# Patient Record
Sex: Male | Born: 1970 | Hispanic: Yes | Marital: Married | Smoking: Former smoker
Health system: Southern US, Community
[De-identification: ages and names within clinical notes are randomized; demographics above are authoritative.]

## PROBLEM LIST (undated history)

## (undated) HISTORY — PX: NO PAST SURGERIES: SHX2092

---

## 2020-07-25 ENCOUNTER — Inpatient Hospital Stay (HOSPITAL_COMMUNITY)
Admission: EM | Admit: 2020-07-25 | Discharge: 2020-07-29 | DRG: 177 | Disposition: A | Discharge status: Home / Self Care | Payer: HRSA Program | Attending: Internal Medicine | Admitting: Internal Medicine

## 2020-07-25 ENCOUNTER — Encounter (HOSPITAL_COMMUNITY): Payer: Self-pay

## 2020-07-25 ENCOUNTER — Emergency Department (HOSPITAL_COMMUNITY): Payer: HRSA Program

## 2020-07-25 ENCOUNTER — Other Ambulatory Visit: Payer: Self-pay

## 2020-07-25 DIAGNOSIS — Z87891 Personal history of nicotine dependence: Secondary | ICD-10-CM

## 2020-07-25 DIAGNOSIS — E871 Hypo-osmolality and hyponatremia: Secondary | ICD-10-CM | POA: Diagnosis present

## 2020-07-25 DIAGNOSIS — J1282 Pneumonia due to coronavirus disease 2019: Secondary | ICD-10-CM | POA: Diagnosis present

## 2020-07-25 DIAGNOSIS — J9601 Acute respiratory failure with hypoxia: Secondary | ICD-10-CM | POA: Diagnosis present

## 2020-07-25 DIAGNOSIS — J9600 Acute respiratory failure, unspecified whether with hypoxia or hypercapnia: Secondary | ICD-10-CM | POA: Diagnosis not present

## 2020-07-25 DIAGNOSIS — U071 COVID-19: Secondary | ICD-10-CM | POA: Diagnosis present

## 2020-07-25 DIAGNOSIS — E860 Dehydration: Secondary | ICD-10-CM | POA: Diagnosis present

## 2020-07-25 DIAGNOSIS — R0902 Hypoxemia: Secondary | ICD-10-CM

## 2020-07-25 LAB — CBC WITH DIFFERENTIAL/PLATELET
Abs Immature Granulocytes: 0.21 10*3/uL — ABNORMAL HIGH (ref 0.00–0.07)
Basophils Absolute: 0.1 10*3/uL (ref 0.0–0.1)
Basophils Relative: 1 %
Eosinophils Absolute: 0 10*3/uL (ref 0.0–0.5)
Eosinophils Relative: 0 %
HCT: 50.6 % (ref 39.0–52.0)
Hemoglobin: 17.5 g/dL — ABNORMAL HIGH (ref 13.0–17.0)
Immature Granulocytes: 2 %
Lymphocytes Relative: 6 %
Lymphs Abs: 0.6 10*3/uL — ABNORMAL LOW (ref 0.7–4.0)
MCH: 30.9 pg (ref 26.0–34.0)
MCHC: 34.6 g/dL (ref 30.0–36.0)
MCV: 89.4 fL (ref 80.0–100.0)
Monocytes Absolute: 0.6 10*3/uL (ref 0.1–1.0)
Monocytes Relative: 5 %
Neutro Abs: 9.6 10*3/uL — ABNORMAL HIGH (ref 1.7–7.7)
Neutrophils Relative %: 86 %
Platelets: 213 10*3/uL (ref 150–400)
RBC: 5.66 MIL/uL (ref 4.22–5.81)
RDW: 11.9 % (ref 11.5–15.5)
WBC: 11 10*3/uL — ABNORMAL HIGH (ref 4.0–10.5)
nRBC: 0 % (ref 0.0–0.2)

## 2020-07-25 LAB — COMPREHENSIVE METABOLIC PANEL
ALT: 67 U/L — ABNORMAL HIGH (ref 0–44)
AST: 34 U/L (ref 15–41)
Albumin: 3.3 g/dL — ABNORMAL LOW (ref 3.5–5.0)
Alkaline Phosphatase: 81 U/L (ref 38–126)
Anion gap: 13 (ref 5–15)
BUN: 21 mg/dL — ABNORMAL HIGH (ref 6–20)
CO2: 22 mmol/L (ref 22–32)
Calcium: 9.2 mg/dL (ref 8.9–10.3)
Chloride: 98 mmol/L (ref 98–111)
Creatinine, Ser: 0.73 mg/dL (ref 0.61–1.24)
GFR, Estimated: >60 mL/min (ref >60)
Glucose, Bld: 147 mg/dL — ABNORMAL HIGH (ref 70–99)
Potassium: 4.3 mmol/L (ref 3.5–5.1)
Sodium: 133 mmol/L — ABNORMAL LOW (ref 135–145)
Total Bilirubin: 0.9 mg/dL (ref 0.3–1.2)
Total Protein: 7.7 g/dL (ref 6.5–8.1)

## 2020-07-25 LAB — PROCALCITONIN: Procalcitonin: <0.1 ng/mL

## 2020-07-25 LAB — LACTATE DEHYDROGENASE: LDH: 222 U/L — ABNORMAL HIGH (ref 98–192)

## 2020-07-25 LAB — LACTIC ACID, PLASMA
Lactic Acid, Venous: 1.1 mmol/L (ref 0.5–1.9)
Lactic Acid, Venous: 1.4 mmol/L (ref 0.5–1.9)

## 2020-07-25 LAB — TRIGLYCERIDES: Triglycerides: 111 mg/dL (ref <150)

## 2020-07-25 LAB — C-REACTIVE PROTEIN: CRP: 9.6 mg/dL — ABNORMAL HIGH (ref <1.0)

## 2020-07-25 LAB — FERRITIN: Ferritin: 1139 ng/mL — ABNORMAL HIGH (ref 24–336)

## 2020-07-25 LAB — RESPIRATORY PANEL BY RT PCR (FLU A&B, COVID)
Influenza A by PCR: NEGATIVE
Influenza B by PCR: NEGATIVE
SARS Coronavirus 2 by RT PCR: POSITIVE — AB

## 2020-07-25 LAB — HIV ANTIBODY (ROUTINE TESTING W REFLEX): HIV Screen 4th Generation wRfx: NONREACTIVE

## 2020-07-25 LAB — FIBRINOGEN: Fibrinogen: >800 mg/dL — ABNORMAL HIGH (ref 210–475)

## 2020-07-25 LAB — D-DIMER, QUANTITATIVE: D-Dimer, Quant: 0.73 ug/mL-FEU — ABNORMAL HIGH (ref 0.00–0.50)

## 2020-07-25 MED ORDER — SODIUM CHLORIDE 0.9 % IV SOLN
100.0000 mg | Freq: Every day | INTRAVENOUS | Status: AC
  Administered 2020-07-26 – 2020-07-29 (×4): 100 mg via INTRAVENOUS
  Filled 2020-07-25 (×4): qty 20

## 2020-07-25 MED ORDER — HYDROCOD POLST-CPM POLST ER 10-8 MG/5ML PO SUER: 5.0000 mL | Freq: Two times a day (BID) | ORAL | Status: DC | PRN

## 2020-07-25 MED ORDER — SODIUM CHLORIDE 0.9 % IV SOLN
200.0000 mg | Freq: Once | INTRAVENOUS | Status: AC
  Administered 2020-07-25: 200 mg via INTRAVENOUS
  Filled 2020-07-25: qty 200

## 2020-07-25 MED ORDER — ZINC SULFATE 220 (50 ZN) MG PO CAPS
220.0000 mg | ORAL_CAPSULE | Freq: Every day | ORAL | Status: DC
  Administered 2020-07-25 – 2020-07-29 (×5): 220 mg via ORAL
  Filled 2020-07-25 (×5): qty 1

## 2020-07-25 MED ORDER — BARICITINIB 2 MG PO TABS
2.0000 mg | ORAL_TABLET | Freq: Every day | ORAL | Status: DC
  Administered 2020-07-25 – 2020-07-26 (×2): 2 mg via ORAL
  Filled 2020-07-25 (×2): qty 1

## 2020-07-25 MED ORDER — SODIUM CHLORIDE 0.9 % IV SOLN: 200.0000 mg | Freq: Once | INTRAVENOUS | Status: DC

## 2020-07-25 MED ORDER — SODIUM CHLORIDE 0.9 % IV SOLN: 100.0000 mg | Freq: Every day | INTRAVENOUS | Status: DC

## 2020-07-25 MED ORDER — IPRATROPIUM-ALBUTEROL 20-100 MCG/ACT IN AERS
1.0000 | INHALATION_SPRAY | Freq: Four times a day (QID) | RESPIRATORY_TRACT | Status: DC
  Administered 2020-07-25 – 2020-07-29 (×14): 1 via RESPIRATORY_TRACT
  Filled 2020-07-25 (×2): qty 4

## 2020-07-25 MED ORDER — METHYLPREDNISOLONE SODIUM SUCC 40 MG IJ SOLR
0.5000 mg/kg | Freq: Once | INTRAMUSCULAR | Status: AC
  Administered 2020-07-25: 36.4 mg via INTRAVENOUS
  Filled 2020-07-25: qty 1

## 2020-07-25 MED ORDER — ENOXAPARIN SODIUM 40 MG/0.4ML ~~LOC~~ SOLN
40.0000 mg | SUBCUTANEOUS | Status: DC
  Administered 2020-07-25: 40 mg via SUBCUTANEOUS
  Filled 2020-07-25: qty 0.4

## 2020-07-25 MED ORDER — GUAIFENESIN-DM 100-10 MG/5ML PO SYRP: 10.0000 mL | ORAL_SOLUTION | ORAL | Status: DC | PRN

## 2020-07-25 MED ORDER — SODIUM CHLORIDE 0.9 % IV SOLN
500.0000 mg | INTRAVENOUS | Status: DC
  Administered 2020-07-25: 500 mg via INTRAVENOUS
  Filled 2020-07-25 (×2): qty 500

## 2020-07-25 MED ORDER — ONDANSETRON HCL 4 MG PO TABS: 4.0000 mg | ORAL_TABLET | Freq: Four times a day (QID) | ORAL | Status: DC | PRN

## 2020-07-25 MED ORDER — SODIUM CHLORIDE 0.9 % IV SOLN: INTRAVENOUS | Status: DC

## 2020-07-25 MED ORDER — ACETAMINOPHEN 325 MG PO TABS: 650.0000 mg | ORAL_TABLET | Freq: Four times a day (QID) | ORAL | Status: DC | PRN

## 2020-07-25 MED ORDER — ONDANSETRON HCL 4 MG/2ML IJ SOLN: 4.0000 mg | Freq: Four times a day (QID) | INTRAMUSCULAR | Status: DC | PRN

## 2020-07-25 MED ORDER — ASCORBIC ACID 500 MG PO TABS
500.0000 mg | ORAL_TABLET | Freq: Every day | ORAL | Status: DC
  Administered 2020-07-25 – 2020-07-29 (×5): 500 mg via ORAL
  Filled 2020-07-25 (×5): qty 1

## 2020-07-25 MED ORDER — DEXAMETHASONE SODIUM PHOSPHATE 10 MG/ML IJ SOLN
6.0000 mg | INTRAMUSCULAR | Status: DC
  Administered 2020-07-25: 6 mg via INTRAVENOUS
  Filled 2020-07-25: qty 1

## 2020-07-25 NOTE — ED Triage Notes (Signed)
Pt c/o increased sob.  Pt's son is COVID + and pt feels he has it too.  Pt is 88-89% on room air. Pt 02 sats drop to 87% with exertion.  Pt has not been vaccinated.

## 2020-07-25 NOTE — H&P (Signed)
History and Physical   Anthony Hodges TZG:[id number] DOB: 03/27/1971 DOA: 07/25/2020  Referring MD/NP/PA: Dr. Linwood Dibbles  PCP: Patient, No Pcp Per   Outpatient Specialists: None  Patient coming from: Home  Chief Complaint: Shortness of breath  HPI: Anthony Hodges is a 49 y.o. male with medical history significant of no significant past medical history previous tobacco abuse presenting with shortness of breath and cough.  He was diagnosed with Covid about 10 days ago.  Placed on 4 mg of dexamethasone at home.  He has had fever on and off which is all about 3 days ago.  In the last 2 days however he has had worsening shortness of breath cough which is nonproductive.  Patient also has felt weak and not himself.  He came to the ER where he was seen and evaluated.  He was found and confirmed to be COVID-19 positive.  He has hypoxia now with oxygen sat 88% on room air requiring 2 L.  Patient is therefore being admitted to the hospital with COVID-19 pneumonia with hypoxia.  He is on vaccinated.  He is scattered family members including his son that were also Covid positive.  No other chronic medical issues and no tobacco use now he had previous tobacco use..  ED Course: Temperature 99.3 blood pressure 130/95 pulse 70 respirate 27 oxygen sat 88% on room air.  White count is 11.1 hemoglobin 17.5 platelets 213.  Sodium 133 potassium 4.2 chloride 90 CO2 22 BUN 21 creatinine 0.73.  Procalcitonin 0.1.  Ferritin 1139 triglyceride 111 LDH 222 CRP of 9.6 and lactic acid 1.1.  Chest x-ray showed bilateral infiltrates consistent with COVID-19 infection.  Patient initiated on treatment for COVID-19 infection being admitted for further treatment.  Review of Systems: As per HPI otherwise 10 point review of systems negative.    History reviewed. No pertinent past medical history.  Past Surgical History:  Procedure Laterality Date   NO PAST SURGERIES       reports that he quit smoking about 30 years ago. His  smoking use included cigarettes. He has never used smokeless tobacco. He reports previous alcohol use. He reports that he does not use drugs.  No Known Allergies  History reviewed. No pertinent family history.   Prior to Admission medications   Medication Sig Start Date End Date Taking? Authorizing Provider  amoxicillin-clavulanate (AUGMENTIN) 875-125 MG tablet Take 1 tablet by mouth 2 (two) times daily. Start date : 07/18/20 07/18/20  Yes [provider]  dexamethasone (DECADRON) 4 MG tablet Take 4 mg by mouth daily.  07/18/20  Yes [provider]    Physical Exam: Vitals:   07/25/20 1317 07/25/20 1319 07/25/20 1630 07/25/20 1720  BP: 132/74  138/86   Pulse: 77  68 70  Resp: (!) 22  (!) 26 20  Temp: 99.3 F (37.4 C)     TempSrc: Oral     SpO2:   94% 93%  Weight:  72.6 kg    Height:  5\' 6"  (1.676 m)        Constitutional: Acutely ill looking, no distress Vitals:   07/25/20 1317 07/25/20 1319 07/25/20 1630 07/25/20 1720  BP: 132/74  138/86   Pulse: 77  68 70  Resp: (!) 22  (!) 26 20  Temp: 99.3 F (37.4 C)     TempSrc: Oral     SpO2:   94% 93%  Weight:  72.6 kg    Height:  5\' 6"  (1.676 m)     Eyes:  PERRL, lids and conjunctivae normal ENMT: Mucous membranes are dry. Posterior pharynx clear of any exudate or lesions.Normal dentition.  Neck: normal, supple, no masses, no thyromegaly Respiratory: Decreased air entry bilaterally with coarse breath sounds, no wheeze, bilateral rhonchi and increased respiratory effort. No accessory muscle use.  Cardiovascular: Regular rate and rhythm, no murmurs / rubs / gallops. No extremity edema. 2+ pedal pulses. No carotid bruits.  Abdomen: no tenderness, no masses palpated. No hepatosplenomegaly. Bowel sounds positive.  Musculoskeletal: no clubbing / cyanosis. No joint deformity upper and lower extremities. Good ROM, no contractures. Normal muscle tone.  Skin: no rashes, lesions, ulcers. No induration Neurologic: CN  2-12 grossly intact. Sensation intact, DTR normal. Strength 5/5 in all 4.  Psychiatric: Normal judgment and insight. Alert and oriented x 3.  Anxious mood.     Labs on Admission: I have personally reviewed following labs and imaging studies  CBC: Recent Labs  Lab 07/25/20 1705  WBC 11.0*  NEUTROABS 9.6*  HGB 17.5*  HCT 50.6  MCV 89.4  PLT 213   Basic Metabolic Panel: Recent Labs  Lab 07/25/20 1705  NA 133*  K 4.3  CL 98  CO2 22  GLUCOSE 147*  BUN 21*  CREATININE 0.73  CALCIUM 9.2   GFR: Estimated Creatinine Clearance: 100.8 mL/min (by C-G formula based on SCr of 0.73 mg/dL). Liver Function Tests: Recent Labs  Lab 07/25/20 1705  AST 34  ALT 67*  ALKPHOS 81  BILITOT 0.9  PROT 7.7  ALBUMIN 3.3*   No results for input(s): LIPASE, AMYLASE in the last 168 hours. No results for input(s): AMMONIA in the last 168 hours. Coagulation Profile: No results for input(s): INR, PROTIME in the last 168 hours. Cardiac Enzymes: No results for input(s): CKTOTAL, CKMB, CKMBINDEX, TROPONINI in the last 168 hours. BNP (last 3 results) No results for input(s): PROBNP in the last 8760 hours. HbA1C: No results for input(s): HGBA1C in the last 72 hours. CBG: No results for input(s): GLUCAP in the last 168 hours. Lipid Profile: Recent Labs    07/25/20 1700  TRIG 111   Thyroid Function Tests: No results for input(s): TSH, T4TOTAL, FREET4, T3FREE, THYROIDAB in the last 72 hours. Anemia Panel: No results for input(s): VITAMINB12, FOLATE, FERRITIN, TIBC, IRON, RETICCTPCT in the last 72 hours. Urine analysis: No results found for: COLORURINE, APPEARANCEUR, LABSPEC, PHURINE, GLUCOSEU, HGBUR, BILIRUBINUR, KETONESUR, PROTEINUR, UROBILINOGEN, NITRITE, LEUKOCYTESUR Sepsis Labs: @LABRCNTIP (procalcitonin:4,lacticidven:4) )No results found for this or any previous visit (from the past 240 hour(s)).   Radiological Exams on Admission: DG Chest Port 1 View  Result Date:  07/25/2020 CLINICAL DATA:  Shortness of breath, family member COVID positive EXAM: PORTABLE CHEST 1 VIEW COMPARISON:  None. FINDINGS: Patchy interstitial opacities at the lung bases. No pleural effusion. No pneumothorax. Normal heart size. IMPRESSION: Patchy interstitial opacities at the lung bases, which could reflect COVID-19. Electronically Signed   By: 07/27/2020 M.D.   On: 07/25/2020 14:21    EKG: Independently reviewed.  Shows sinus rhythm with nonspecific ST changes.  Assessment/Plan Principal Problem:   Acute respiratory failure due to COVID-19 [facility]) Active Problems:   Hyponatremia   Dehydration     #1 COVID-19 pneumonia with hypoxia: Patient will be admitted.  Initiate remdesivir kalemia dexamethasone as well as breathing treatments.  Cough medications as well as empiric treatment for secondary bacterial pneumonia with azithromycin.  Patient will be maintained on oxygen for now until titrated off.  Expect patient to span at least 2 midnights in the  hospital.  #2 hyponatremia: Most likely secondary dehydration.  We will hydrate slowly.  Goal is to keep him close to being dry as a Covid 19 infected individual.  #3 dehydration: As per #2 above  #4 leukocytosis: Secondary to pneumonia.  Monitor especially on steroids.   DVT prophylaxis: Lovenox Code Status: Full code Family Communication: No family at bedside Disposition Plan: Home Consults called: None Admission status: Inpatient  Severity of Illness: The appropriate patient status for this patient is INPATIENT. Inpatient status is judged to be reasonable and necessary in order to provide the required intensity of service to ensure the patient's safety. The patient's presenting symptoms, physical exam findings, and initial radiographic and laboratory data in the context of their chronic comorbidities is felt to place them at high risk for further clinical deterioration. Furthermore, it is not anticipated that the patient  will be medically stable for discharge from the hospital within 2 midnights of admission. The following factors support the patient status of inpatient.   " The patient's presenting symptoms include shortness of breath and cough. " The worrisome physical exam findings include coarse breath sound bilaterally with evidence of respiratory distress. " The initial radiographic and laboratory data are worrisome because of bilateral chest x-ray findings. " The chronic co-morbidities include none.   * I certify that at the point of admission it is my clinical judgment that the patient will require inpatient hospital care spanning beyond 2 midnights from the point of admission due to high intensity of service, high risk for further deterioration and high frequency of surveillance required.Lonia Blood MD [facility] Pager [phone]  If 7PM-7AM, please contact night-coverage [email or web address] Password [facility]  07/25/2020, 6:52 PM

## 2020-07-25 NOTE — ED Provider Notes (Signed)
[facility]-EMERGENCY DEPT Provider Note   CSN: [id number] Arrival date & time: 07/25/20  1234     History Chief Complaint  Patient presents with  . Shortness of Breath    Anthony Hodges is a 49 y.o. male presenting for evaluation of shortness of breath.  Patient states he developed symptoms of Covid 10 days ago.  He reports mostly a sore throat and body aches and fever.  Fever resolved 3 days ago.  Over the past 2 days, he has had gradually worsening shortness of breath, and today he woke up with severe shortness of breath and coughing fits.  This prompted his visit to the ER.  Patient states his son is Covid positive, he has not been tested.  He reports no other medical problems, takes medications daily.  No history of asthma or COPD.  He is unvaccinated for Covid.  Denies tobacco, alcohol, or drug use.  He has not taken anything for his symptoms.  HPI     History reviewed. No pertinent past medical history.  There are no problems to display for this patient.   Past Surgical History:  Procedure Laterality Date  . NO PAST SURGERIES         History reviewed. No pertinent family history.  Social History   Tobacco Use  . Smoking status: Former Smoker    Types: Cigarettes    Quit date: 07/25/1990    Years since quitting: 30.0  . Smokeless tobacco: Never Used  Vaping Use  . Vaping Use: Never used  Substance Use Topics  . Alcohol use: Not Currently    Comment: drank for 2 years when 49 years old  . Drug use: Never    Home Medications Prior to Admission medications   Medication Sig Start Date End Date Taking? Authorizing Provider  amoxicillin-clavulanate (AUGMENTIN) 875-125 MG tablet Take 1 tablet by mouth 2 (two) times daily. Start date : 07/18/20 07/18/20  Yes [provider]  dexamethasone (DECADRON) 4 MG tablet Take 4 mg by mouth daily.  07/18/20  Yes [provider]    Allergies    Patient has no known  allergies.  Review of Systems   Review of Systems  Constitutional: Positive for fever (resolved).  Respiratory: Positive for cough and shortness of breath.   All other systems reviewed and are negative.   Physical Exam Updated Vital Signs BP 138/86 (BP Location: Right Arm)   Pulse 70   Temp 99.3 F (37.4 C) (Oral)   Resp 20   Ht 5\' 6"  (1.676 m)   Wt 72.6 kg   SpO2 93%   BMI 25.82 kg/m   Physical Exam Vitals and nursing note reviewed.  Constitutional:      General: He is not in acute distress.    Appearance: He is well-developed.     Comments: Appears nontoxic  HENT:     Head: Normocephalic and atraumatic.  Eyes:     Conjunctiva/sclera: Conjunctivae normal.     Pupils: Pupils are equal, round, and reactive to light.  Cardiovascular:     Rate and Rhythm: Normal rate and regular rhythm.     Pulses: Normal pulses.  Pulmonary:     Effort: Pulmonary effort is normal. No respiratory distress.     Breath sounds: Rales present. No wheezing.     Comments: Scattered rales in lower bilateral lobes.  Speaking in full sentences.  On 2 L via nasal cannula, sats 93 to 94%.  Intermittent cough noted.  Per RN, patient  hypoxic in the 80s on room air at rest on arrival. Abdominal:     General: There is no distension.     Palpations: Abdomen is soft. There is no mass.     Tenderness: There is no abdominal tenderness. There is no guarding or rebound.  Musculoskeletal:        General: Normal range of motion.     Cervical back: Normal range of motion and neck supple.  Skin:    General: Skin is warm and dry.     Capillary Refill: Capillary refill takes less than 2 seconds.  Neurological:     Mental Status: He is alert and oriented to person, place, and time.     ED Results / Procedures / Treatments   Labs (all labs ordered are listed, but only abnormal results are displayed) Labs Reviewed  CBC WITH DIFFERENTIAL/PLATELET - Abnormal; Notable for the following components:      Result  Value   WBC 11.0 (*)    Hemoglobin 17.5 (*)    Neutro Abs 9.6 (*)    Lymphs Abs 0.6 (*)    Abs Immature Granulocytes 0.21 (*)    All other components within normal limits  COMPREHENSIVE METABOLIC PANEL - Abnormal; Notable for the following components:   Sodium 133 (*)    Glucose, Bld 147 (*)    BUN 21 (*)    Albumin 3.3 (*)    ALT 67 (*)    All other components within normal limits  LACTATE DEHYDROGENASE - Abnormal; Notable for the following components:   LDH 222 (*)    All other components within normal limits  RESPIRATORY PANEL BY RT PCR (FLU A&B, COVID)  CULTURE, BLOOD (ROUTINE X 2)  CULTURE, BLOOD (ROUTINE X 2)  LACTIC ACID, PLASMA  TRIGLYCERIDES  LACTIC ACID, PLASMA  D-DIMER, QUANTITATIVE (NOT AT [facility])  PROCALCITONIN  FERRITIN  FIBRINOGEN  C-REACTIVE PROTEIN    EKG EKG Interpretation  Date/Time:  Wednesday July 25 2020 15:41:42 EDT Ventricular Rate:  69 PR Interval:    QRS Duration: 91 QT Interval:  388 QTC Calculation: 416 R Axis:   -44 Text Interpretation: Sinus rhythm Left axis deviation RSR' in V1 or V2, right VCD or RVH Minimal ST elevation, anterior leads No old tracing to compare Confirmed by Linwood Dibbles [phone]) on 07/25/2020 4:07:43 PM   Radiology DG Chest Port 1 View  Result Date: 07/25/2020 CLINICAL DATA:  Shortness of breath, family member COVID positive EXAM: PORTABLE CHEST 1 VIEW COMPARISON:  None. FINDINGS: Patchy interstitial opacities at the lung bases. No pleural effusion. No pneumothorax. Normal heart size. IMPRESSION: Patchy interstitial opacities at the lung bases, which could reflect COVID-19. Electronically Signed   By: Guadlupe Spanish M.D.   On: 07/25/2020 14:21    Procedures Procedures (including critical care time)  Medications Ordered in ED Medications  methylPREDNISolone sodium succinate (SOLU-MEDROL) 40 mg/mL injection 36.4 mg (36.4 mg Intravenous Given 07/25/20 1712)    ED Course  I have reviewed the triage vital signs and  the nursing notes.  Pertinent labs & imaging results that were available during my care of the patient were reviewed by me and considered in my medical decision making (see chart for details).    MDM Rules/Calculators/A&P                          Patient presenting for evaluation of shortness of breath and cough.  He developed symptoms of Covid 10 days ago, son  is Covid positive.  Shortness of breath worsened acutely today.  On exam, patient appears nontoxic, although he did arrive hypoxic in the 80s.  This is improved with 2 L via nasal cannula.  Will obtain inflammatory markers, chest x-ray, and give Solu-Medrol.  Patient will need to be admitted due to hypoxia in the setting of a likely Covid infection.  Chest x-ray viewed interpreted by me, shows mild hazy opacities consistent with Covid pneumonia.  Labs overall reassuring.  Some inflammatory markers elevated.  Patient remained stable on 2 L.  Will call for admission.  Discussed with Dr. Mikeal Hawthorne from [facility], patient to be admitted.  Anthony Hodges was evaluated in Emergency Department on 07/25/2020 for the symptoms described in the history of present illness. He was evaluated in the context of the global COVID-19 pandemic, which necessitated consideration that the patient might be at risk for infection with the SARS-CoV-2 virus that causes COVID-19. Institutional protocols and algorithms that pertain to the evaluation of patients at risk for COVID-19 are in a state of rapid change based on information released by regulatory bodies including the CDC and federal and state organizations. These policies and algorithms were followed during the patient's care in the ED.  Final Clinical Impression(s) / ED Diagnoses Final diagnoses:  COVID-19  Hypoxia  Pneumonia due to COVID-19 virus    Rx / DC Orders ED Discharge Orders    None       Alveria Apley, PA-C 07/25/20 1847    Linwood Dibbles, MD 07/26/20 [phone]

## 2020-07-25 NOTE — ED Notes (Signed)
Date and time results received: 07/25/20 1920  Test: Respiratory Panel  Critical Value: Positive   Name of Provider Notified: Mikeal Hawthorne, MD

## 2020-07-26 DIAGNOSIS — J9600 Acute respiratory failure, unspecified whether with hypoxia or hypercapnia: Secondary | ICD-10-CM | POA: Diagnosis not present

## 2020-07-26 DIAGNOSIS — E860 Dehydration: Secondary | ICD-10-CM | POA: Diagnosis not present

## 2020-07-26 DIAGNOSIS — E871 Hypo-osmolality and hyponatremia: Secondary | ICD-10-CM

## 2020-07-26 DIAGNOSIS — J9601 Acute respiratory failure with hypoxia: Secondary | ICD-10-CM

## 2020-07-26 DIAGNOSIS — U071 COVID-19: Secondary | ICD-10-CM | POA: Diagnosis not present

## 2020-07-26 LAB — COMPREHENSIVE METABOLIC PANEL
ALT: 73 U/L — ABNORMAL HIGH (ref 0–44)
AST: 41 U/L (ref 15–41)
Albumin: 2.8 g/dL — ABNORMAL LOW (ref 3.5–5.0)
Alkaline Phosphatase: 64 U/L (ref 38–126)
Anion gap: 9 (ref 5–15)
BUN: 18 mg/dL (ref 6–20)
CO2: 22 mmol/L (ref 22–32)
Calcium: 8.2 mg/dL — ABNORMAL LOW (ref 8.9–10.3)
Chloride: 101 mmol/L (ref 98–111)
Creatinine, Ser: 0.71 mg/dL (ref 0.61–1.24)
GFR, Estimated: >60 mL/min (ref >60)
Glucose, Bld: 151 mg/dL — ABNORMAL HIGH (ref 70–99)
Potassium: 4.2 mmol/L (ref 3.5–5.1)
Sodium: 132 mmol/L — ABNORMAL LOW (ref 135–145)
Total Bilirubin: 0.8 mg/dL (ref 0.3–1.2)
Total Protein: 6.7 g/dL (ref 6.5–8.1)

## 2020-07-26 LAB — CBC WITH DIFFERENTIAL/PLATELET
Abs Immature Granulocytes: 0.2 10*3/uL — ABNORMAL HIGH (ref 0.00–0.07)
Basophils Absolute: 0.1 10*3/uL (ref 0.0–0.1)
Basophils Relative: 1 %
Eosinophils Absolute: 0 10*3/uL (ref 0.0–0.5)
Eosinophils Relative: 0 %
HCT: 46.5 % (ref 39.0–52.0)
Hemoglobin: 16 g/dL (ref 13.0–17.0)
Immature Granulocytes: 2 %
Lymphocytes Relative: 11 %
Lymphs Abs: 0.9 10*3/uL (ref 0.7–4.0)
MCH: 31 pg (ref 26.0–34.0)
MCHC: 34.4 g/dL (ref 30.0–36.0)
MCV: 90.1 fL (ref 80.0–100.0)
Monocytes Absolute: 0.7 10*3/uL (ref 0.1–1.0)
Monocytes Relative: 8 %
Neutro Abs: 6.4 10*3/uL (ref 1.7–7.7)
Neutrophils Relative %: 78 %
Platelets: 231 10*3/uL (ref 150–400)
RBC: 5.16 MIL/uL (ref 4.22–5.81)
RDW: 11.9 % (ref 11.5–15.5)
WBC: 8.2 10*3/uL (ref 4.0–10.5)
nRBC: 0 % (ref 0.0–0.2)

## 2020-07-26 LAB — PHOSPHORUS: Phosphorus: 3.2 mg/dL (ref 2.5–4.6)

## 2020-07-26 LAB — C-REACTIVE PROTEIN: CRP: 4.3 mg/dL — ABNORMAL HIGH (ref <1.0)

## 2020-07-26 LAB — FERRITIN: Ferritin: 953 ng/mL — ABNORMAL HIGH (ref 24–336)

## 2020-07-26 LAB — D-DIMER, QUANTITATIVE: D-Dimer, Quant: 0.83 ug/mL-FEU — ABNORMAL HIGH (ref 0.00–0.50)

## 2020-07-26 LAB — MAGNESIUM: Magnesium: 2.4 mg/dL (ref 1.7–2.4)

## 2020-07-26 MED ORDER — METHYLPREDNISOLONE SODIUM SUCC 40 MG IJ SOLR
40.0000 mg | Freq: Two times a day (BID) | INTRAMUSCULAR | Status: DC
  Administered 2020-07-26 – 2020-07-28 (×5): 40 mg via INTRAVENOUS
  Filled 2020-07-26 (×5): qty 1

## 2020-07-26 MED ORDER — HYDROCOD POLST-CPM POLST ER 10-8 MG/5ML PO SUER
5.0000 mL | Freq: Two times a day (BID) | ORAL | Status: DC
  Administered 2020-07-26 – 2020-07-29 (×7): 5 mL via ORAL
  Filled 2020-07-26 (×7): qty 5

## 2020-07-26 MED ORDER — BARICITINIB 2 MG PO TABS
4.0000 mg | ORAL_TABLET | Freq: Every day | ORAL | Status: DC
  Administered 2020-07-27 – 2020-07-29 (×3): 4 mg via ORAL
  Filled 2020-07-26 (×3): qty 2

## 2020-07-26 MED ORDER — ALBUTEROL SULFATE HFA 108 (90 BASE) MCG/ACT IN AERS
1.0000 | INHALATION_SPRAY | RESPIRATORY_TRACT | Status: DC | PRN
  Filled 2020-07-26: qty 6.7

## 2020-07-26 MED ORDER — BARICITINIB 2 MG PO TABS
2.0000 mg | ORAL_TABLET | Freq: Once | ORAL | Status: AC
  Administered 2020-07-26: 2 mg via ORAL
  Filled 2020-07-26: qty 1

## 2020-07-26 NOTE — ED Notes (Signed)
Pt received meal tray.CS °

## 2020-07-26 NOTE — Progress Notes (Signed)
Patient arrived from ED via stretcher. Ambulate to bedside with steady gait. All VSS and 92% on Room Air. No complaints or signs of distress noted. Call bell in reach. Will continue to monitor.

## 2020-07-26 NOTE — Progress Notes (Addendum)
PROGRESS NOTE    Anthony Hodges  OVF:[id number] DOB: [birth year] DOA: 07/25/2020 PCP: Patient, No Pcp Per    Brief Narrative:  Anthony Hodges was admitted to the hospital with the working diagnosis of acute hypoxic respiratory failure due to SARS COVID-19 viral pneumonia.  49 year old male with no significant past medical history who presents with dyspnea and cough.  He was diagnosed with SARS COVID-19 on 07/15/2020.  He was treated as an outpatient with 4 mg of dexamethasone, despite systemic steroids he continued to have intermittent fevers and for the last 2 days worsening dyspnea that prompted him to come to the hospital.  On his initial physical examination his oxygen saturation was 88% on room air, temperature 99.3, blood pressure 130/95, heart rate 70, respiratory rate 27.  His lungs had decreased breath sounds bilaterally, no wheezing but bilateral rhonchi.  Increased work of breathing.  Heart S1-S2, present rhythmic, soft abdomen, no lower extremity edema. Sodium 133, potassium 4.3, chloride 98, bicarb 22, glucose 147, BUN 21, creatinine 0.73, white count 11.0, hemoglobin 17.5, hematocrit 50.6, platelets 213.  SARS COVID-19 positive. Chest radiograph with bilateral lower lobe faint interstitial infiltrates. EKG 69 bpm, left axis deviation, normal intervals, sinus rhythm, J-point elevation V1 through V3, no ST segment or T wave changes.   Assessment & Plan:   Principal Problem:   Pneumonia due to COVID-19 virus Active Problems:   Hyponatremia   Dehydration   Acute respiratory failure with hypoxia (HCC)   1.  Acute hypoxic respiratory failure due to SARS COVID-19 viral pneumonia.  RR: 22 to 29  Pulse oxymetry: 92 to 93%  Fi02: 2 L/min per Rachel   COVID-19 Labs  Recent Labs    07/25/20 1705 07/25/20 1707 07/26/20 0558  DDIMER 0.73*  --  0.83*  FERRITIN  --  1,139* 953*  LDH 222*  --   --   CRP  --  9.6* 4.3*    Lab Results  Component Value Date   SARSCOV2NAA POSITIVE (A)  07/25/2020    Patient with low oxygen requirements, less than 5 L/min  Continue medical therapy with systemic steroids (change dexamethasone for methylprednisolone), baricitinib and antiviral therapy with Remdesivir. Bronchodilator therapy, antitussive agents and airway clearing techniques with incentive spirometer and flutter valve.   Continue to follow up on inflammatory markers and oxygen requirements.  Out of bed to chair tid with meals.   2. Hypovolemic hyponatremia. Na continue to be low at 133, will continue to encourage po intake, hold on IV fluids in the setting of viral pneumonia.  Follow up on renal function and electrolytes in am.   3. Reactive leukocytosis. Wbc is down to 8.2. No clinical signs of bacterial pneumonia.   Patient continue to be at high risk for worsening hypoxemic respiratory failure.   Status is: Inpatient  Remains inpatient appropriate because:IV treatments appropriate due to intensity of illness or inability to take PO   Dispo: The patient is from: Home              Anticipated d/c is to: Home              Anticipated d/c date is: 3 days              Patient currently is not medically stable to d/c.   DVT prophylaxis: Enoxaparin   Code Status:   full  Family Communication:  Patient communicating with family.      Subjective: Patient with persistent dyspnea but mild improvement compared to last night,  not yet back to baseline, no nausea or vomiting.   Objective: Vitals:   07/26/20 0840 07/26/20 0926 07/26/20 1009 07/26/20 1028  BP: (!) 143/96 (!) 137/94 (!) 136/111 (!) 158/99  Pulse: 84 78 86 82  Resp: (!) 24 (!) 25 (!) 29 (!) 22  Temp:  98 F (36.7 C) 98.7 F (37.1 C) 97.6 F (36.4 C)  TempSrc:  Oral  Oral  SpO2: 90% 93% 93% 92%  Weight:      Height:       No intake or output data in the 24 hours ending 07/26/20 1032 Filed Weights   07/25/20 1319  Weight: 72.6 kg    Examination:   General: Not in pain or dyspnea. Deconditioned   Neurology: Awake and alert, non focal  E ENT: mild pallor, no icterus, oral mucosa moist Cardiovascular: No JVD. S1-S2 present, rhythmic, no gallops, rubs, or murmurs. No lower extremity edema. Pulmonary: positive breath sounds bilaterally,with no wheezing, rhonchi or rales. Gastrointestinal. Abdomen soft and non tender Skin. No rashes Musculoskeletal: no joint deformities     Data Reviewed: I have personally reviewed following labs and imaging studies  CBC: Recent Labs  Lab 07/25/20 1705 07/26/20 0558  WBC 11.0* 8.2  NEUTROABS 9.6* 6.4  HGB 17.5* 16.0  HCT 50.6 46.5  MCV 89.4 90.1  PLT 213 231   Basic Metabolic Panel: Recent Labs  Lab 07/25/20 1705 07/26/20 0558  NA 133* 132*  K 4.3 4.2  CL 98 101  CO2 22 22  GLUCOSE 147* 151*  BUN 21* 18  CREATININE 0.73 0.71  CALCIUM 9.2 8.2*  MG  --  2.4  PHOS  --  3.2   GFR: Estimated Creatinine Clearance: 100.8 mL/min (by C-G formula based on SCr of 0.71 mg/dL). Liver Function Tests: Recent Labs  Lab 07/25/20 1705 07/26/20 0558  AST 34 41  ALT 67* 73*  ALKPHOS 81 64  BILITOT 0.9 0.8  PROT 7.7 6.7  ALBUMIN 3.3* 2.8*   No results for input(s): LIPASE, AMYLASE in the last 168 hours. No results for input(s): AMMONIA in the last 168 hours. Coagulation Profile: No results for input(s): INR, PROTIME in the last 168 hours. Cardiac Enzymes: No results for input(s): CKTOTAL, CKMB, CKMBINDEX, TROPONINI in the last 168 hours. BNP (last 3 results) No results for input(s): PROBNP in the last 8760 hours. HbA1C: No results for input(s): HGBA1C in the last 72 hours. CBG: No results for input(s): GLUCAP in the last 168 hours. Lipid Profile: Recent Labs    07/25/20 1700  TRIG 111   Thyroid Function Tests: No results for input(s): TSH, T4TOTAL, FREET4, T3FREE, THYROIDAB in the last 72 hours. Anemia Panel: Recent Labs    07/25/20 1707 07/26/20 0558  FERRITIN 1,139* 953*      Radiology Studies: I have reviewed  all of the imaging during this hospital visit personally     Scheduled Meds: . vitamin C  500 mg Oral Daily  . baricitinib  2 mg Oral Daily  . dexamethasone (DECADRON) injection  6 mg Intravenous Q24H  . enoxaparin (LOVENOX) injection  40 mg Subcutaneous Q24H  . Ipratropium-Albuterol  1 puff Inhalation Q6H  . zinc sulfate  220 mg Oral Daily   Continuous Infusions: . sodium chloride 100 mL/hr at 07/25/20 2007  . azithromycin Stopped (07/25/20 2108)  . remdesivir 100 mg in NS 100 mL       LOS: 1 day        Camdyn Beske Annett Gula, MD

## 2020-07-26 NOTE — ED Notes (Signed)
EMS-report given to Katie, RN-transport to floor

## 2020-07-27 DIAGNOSIS — U071 COVID-19: Secondary | ICD-10-CM | POA: Diagnosis not present

## 2020-07-27 DIAGNOSIS — J9601 Acute respiratory failure with hypoxia: Secondary | ICD-10-CM

## 2020-07-27 DIAGNOSIS — E860 Dehydration: Secondary | ICD-10-CM | POA: Diagnosis not present

## 2020-07-27 DIAGNOSIS — E871 Hypo-osmolality and hyponatremia: Secondary | ICD-10-CM | POA: Diagnosis not present

## 2020-07-27 DIAGNOSIS — J1282 Pneumonia due to coronavirus disease 2019: Secondary | ICD-10-CM

## 2020-07-27 LAB — COMPREHENSIVE METABOLIC PANEL
ALT: 79 U/L — ABNORMAL HIGH (ref 0–44)
AST: 32 U/L (ref 15–41)
Albumin: 3 g/dL — ABNORMAL LOW (ref 3.5–5.0)
Alkaline Phosphatase: 64 U/L (ref 38–126)
Anion gap: 9 (ref 5–15)
BUN: 22 mg/dL — ABNORMAL HIGH (ref 6–20)
CO2: 23 mmol/L (ref 22–32)
Calcium: 8.6 mg/dL — ABNORMAL LOW (ref 8.9–10.3)
Chloride: 102 mmol/L (ref 98–111)
Creatinine, Ser: 0.69 mg/dL (ref 0.61–1.24)
GFR, Estimated: >60 mL/min (ref >60)
Glucose, Bld: 134 mg/dL — ABNORMAL HIGH (ref 70–99)
Potassium: 4.5 mmol/L (ref 3.5–5.1)
Sodium: 134 mmol/L — ABNORMAL LOW (ref 135–145)
Total Bilirubin: 0.7 mg/dL (ref 0.3–1.2)
Total Protein: 6.6 g/dL (ref 6.5–8.1)

## 2020-07-27 LAB — D-DIMER, QUANTITATIVE: D-Dimer, Quant: 0.61 ug/mL-FEU — ABNORMAL HIGH (ref 0.00–0.50)

## 2020-07-27 LAB — FERRITIN: Ferritin: 831 ng/mL — ABNORMAL HIGH (ref 24–336)

## 2020-07-27 LAB — C-REACTIVE PROTEIN: CRP: 1.4 mg/dL — ABNORMAL HIGH (ref <1.0)

## 2020-07-27 NOTE — Plan of Care (Signed)

## 2020-07-27 NOTE — Progress Notes (Addendum)
PROGRESS NOTE    Anthony Hodges  ION:[id number] DOB: 1971-06-16 DOA: 07/25/2020 PCP: Patient, No Pcp Per    Brief Narrative:  Anthony Hodges was admitted to the hospital with the working diagnosis of acute hypoxic respiratory failure due to SARS COVID-19 viral pneumonia.  49 year old male with no significant past medical history who presents with dyspnea and cough.  He was diagnosed with SARS COVID-19 on 07/15/2020.  He was treated as an outpatient with 4 mg of dexamethasone, despite systemic steroids he continued to have intermittent fevers and for the last 2 days worsening dyspnea that prompted him to come to the hospital.  On his initial physical examination his oxygen saturation was 88% on room air, temperature 99.3, blood pressure 130/95, heart rate 70, respiratory rate 27.  His lungs had decreased breath sounds bilaterally, no wheezing but bilateral rhonchi.  Increased work of breathing.  Heart S1-S2, present rhythmic, soft abdomen, no lower extremity edema. Sodium 133, potassium 4.3, chloride 98, bicarb 22, glucose 147, BUN 21, creatinine 0.73, white count 11.0, hemoglobin 17.5, hematocrit 50.6, platelets 213.  SARS COVID-19 positive. Chest radiograph with bilateral lower lobe faint interstitial infiltrates. EKG 69 bpm, left axis deviation, normal intervals, sinus rhythm, J-point elevation V1 through V3, no ST segment or T wave changes.   Assessment & Plan:   Principal Problem:   Pneumonia due to COVID-19 virus Active Problems:   Hyponatremia   Dehydration   Acute respiratory failure with hypoxia (HCC)   1.  Acute hypoxic respiratory failure due to SARS COVID-19 viral pneumonia.  RR: 20  Pulse oxymetry: 95%  Fi02: 21% room air.    COVID-19 Labs  Recent Labs    07/25/20 1705 07/25/20 1707 07/26/20 0558 07/27/20 0340  DDIMER 0.73*  --  0.83* 0.61*  FERRITIN  --  1,139* 953* 831*  LDH 222*  --   --   --   CRP  --  9.6* 4.3* 1.4*    Lab Results  Component Value Date    SARSCOV2NAA POSITIVE (A) 07/25/2020    Inflammatory markers are trending down, patient is on room air. He has been staying prone, and doing airway clearing techniques with incentive spirometer and flutter valve.   Tolerating will medical therapy with methylprednisolone, baricitinib and antiviral therapy with Remdesivir #3/5. Continue with bnchodilator therapy.   Possible dc in 48 H.   2. Hypovolemic hyponatremia. Patient is tolerating po well, Na is up to 134, renal function stable with serum cr at 0,69. Follow up on renal function in am.   3. Reactive leukocytosis. Resolved.     Status is: Inpatient  Remains inpatient appropriate because:IV treatments appropriate due to intensity of illness or inability to take PO   Dispo: The patient is from: Home              Anticipated d/c is to: Home              Anticipated d/c date is: 2 days              Patient currently is not medically stable to d/c.   DVT prophylaxis: Enoxaparin   Code Status:   full  Family Communication:  Patient communicating with his family with his phone.      Subjective: Patient continue to improve, not yet back to baseline, no nausea or vomiting, has been out of bed.   Objective: Vitals:   07/26/20 1424 07/26/20 1824 07/26/20 2230 07/27/20 0542  BP: (!) 143/95 133/87 (!) 136/93 (!) 132/92  Pulse: 70 73 60 (!) 59  Resp: 20 18 16 20   Temp: 98.7 F (37.1 C) 98.3 F (36.8 C) 98.2 F (36.8 C) 98.2 F (36.8 C)  TempSrc:   Oral   SpO2: 97% 94% 95% 95%  Weight:      Height:        Intake/Output Summary (Last 24 hours) at 07/27/2020 1123 Last data filed at 07/27/2020 07/29/2020 Gross per 24 hour  Intake 1728.33 ml  Output 1600 ml  Net 128.33 ml   Filed Weights   07/25/20 1319  Weight: 72.6 kg    Examination:   General: Not in pain or dyspnea  Neurology: Awake and alert, non focal  E ENT: positive pallor, no icterus, oral mucosa moist Cardiovascular: No JVD. S1-S2 present, rhythmic, no  gallops, rubs, or murmurs. No lower extremity edema. Pulmonary: positive breath sounds bilaterally, with no wheezing, rhonchi or rales. Gastrointestinal. Abdomen soft and non tender.  Skin. No rashes Musculoskeletal: no joint deformities     Data Reviewed: I have personally reviewed following labs and imaging studies  CBC: Recent Labs  Lab 07/25/20 1705 07/26/20 0558  WBC 11.0* 8.2  NEUTROABS 9.6* 6.4  HGB 17.5* 16.0  HCT 50.6 46.5  MCV 89.4 90.1  PLT 213 231   Basic Metabolic Panel: Recent Labs  Lab 07/25/20 1705 07/26/20 0558 07/27/20 0340  NA 133* 132* 134*  K 4.3 4.2 4.5  CL 98 101 102  CO2 22 22 23   GLUCOSE 147* 151* 134*  BUN 21* 18 22*  CREATININE 0.73 0.71 0.69  CALCIUM 9.2 8.2* 8.6*  MG  --  2.4  --   PHOS  --  3.2  --    GFR: Estimated Creatinine Clearance: 100.8 mL/min (by C-G formula based on SCr of 0.69 mg/dL). Liver Function Tests: Recent Labs  Lab 07/25/20 1705 07/26/20 0558 07/27/20 0340  AST 34 41 32  ALT 67* 73* 79*  ALKPHOS 81 64 64  BILITOT 0.9 0.8 0.7  PROT 7.7 6.7 6.6  ALBUMIN 3.3* 2.8* 3.0*   No results for input(s): LIPASE, AMYLASE in the last 168 hours. No results for input(s): AMMONIA in the last 168 hours. Coagulation Profile: No results for input(s): INR, PROTIME in the last 168 hours. Cardiac Enzymes: No results for input(s): CKTOTAL, CKMB, CKMBINDEX, TROPONINI in the last 168 hours. BNP (last 3 results) No results for input(s): PROBNP in the last 8760 hours. HbA1C: No results for input(s): HGBA1C in the last 72 hours. CBG: No results for input(s): GLUCAP in the last 168 hours. Lipid Profile: Recent Labs    07/25/20 1700  TRIG 111   Thyroid Function Tests: No results for input(s): TSH, T4TOTAL, FREET4, T3FREE, THYROIDAB in the last 72 hours. Anemia Panel: Recent Labs    07/26/20 0558 07/27/20 0340  FERRITIN 953* 831*      Radiology Studies: I have reviewed all of the imaging during this hospital visit  personally     Scheduled Meds: . vitamin C  500 mg Oral Daily  . baricitinib  4 mg Oral Daily  . chlorpheniramine-HYDROcodone  5 mL Oral Q12H  . Ipratropium-Albuterol  1 puff Inhalation Q6H  . methylPREDNISolone (SOLU-MEDROL) injection  40 mg Intravenous Q12H  . zinc sulfate  220 mg Oral Daily   Continuous Infusions: . remdesivir 100 mg in NS 100 mL 100 mg (07/27/20 1020)     LOS: 2 days        Anthony Hodges 07/29/20, MD

## 2020-07-27 NOTE — Plan of Care (Signed)

## 2020-07-28 DIAGNOSIS — E871 Hypo-osmolality and hyponatremia: Secondary | ICD-10-CM | POA: Diagnosis not present

## 2020-07-28 DIAGNOSIS — E860 Dehydration: Secondary | ICD-10-CM | POA: Diagnosis not present

## 2020-07-28 DIAGNOSIS — J9601 Acute respiratory failure with hypoxia: Secondary | ICD-10-CM | POA: Diagnosis not present

## 2020-07-28 DIAGNOSIS — U071 COVID-19: Secondary | ICD-10-CM | POA: Diagnosis not present

## 2020-07-28 LAB — COMPREHENSIVE METABOLIC PANEL
ALT: 64 U/L — ABNORMAL HIGH (ref 0–44)
AST: 23 U/L (ref 15–41)
Albumin: 2.8 g/dL — ABNORMAL LOW (ref 3.5–5.0)
Alkaline Phosphatase: 61 U/L (ref 38–126)
Anion gap: 10 (ref 5–15)
BUN: 22 mg/dL — ABNORMAL HIGH (ref 6–20)
CO2: 22 mmol/L (ref 22–32)
Calcium: 8.6 mg/dL — ABNORMAL LOW (ref 8.9–10.3)
Chloride: 100 mmol/L (ref 98–111)
Creatinine, Ser: 0.72 mg/dL (ref 0.61–1.24)
GFR, Estimated: >60 mL/min (ref >60)
Glucose, Bld: 156 mg/dL — ABNORMAL HIGH (ref 70–99)
Potassium: 4.5 mmol/L (ref 3.5–5.1)
Sodium: 132 mmol/L — ABNORMAL LOW (ref 135–145)
Total Bilirubin: 0.7 mg/dL (ref 0.3–1.2)
Total Protein: 6.3 g/dL — ABNORMAL LOW (ref 6.5–8.1)

## 2020-07-28 LAB — D-DIMER, QUANTITATIVE: D-Dimer, Quant: 0.73 ug/mL-FEU — ABNORMAL HIGH (ref 0.00–0.50)

## 2020-07-28 LAB — C-REACTIVE PROTEIN: CRP: 0.6 mg/dL (ref <1.0)

## 2020-07-28 LAB — FERRITIN: Ferritin: 799 ng/mL — ABNORMAL HIGH (ref 24–336)

## 2020-07-28 MED ORDER — ENOXAPARIN SODIUM 40 MG/0.4ML ~~LOC~~ SOLN
40.0000 mg | SUBCUTANEOUS | Status: DC
  Administered 2020-07-28: 40 mg via SUBCUTANEOUS
  Filled 2020-07-28: qty 0.4

## 2020-07-28 MED ORDER — METHYLPREDNISOLONE SODIUM SUCC 40 MG IJ SOLR
40.0000 mg | Freq: Every day | INTRAMUSCULAR | Status: DC
  Administered 2020-07-29: 40 mg via INTRAVENOUS
  Filled 2020-07-28: qty 1

## 2020-07-28 NOTE — Progress Notes (Signed)
PROGRESS NOTE    Danford Tat  KYH:[id number] DOB: 12-Oct-1970 DOA: 07/25/2020 PCP: Patient, No Pcp Per    Brief Narrative:  Mr.Munozwas admitted to the hospital with the working diagnosis of acute hypoxic respiratory failure due to SARS COVID-19 viral pneumonia.  49 year old male with no significant past medical history who presents with dyspnea and cough. He was diagnosed with SARS COVID-19 on 07/15/2020. He was treated as an outpatient with 4 mg of dexamethasone, despite systemic steroids he continued to have intermittent fevers and for the last 2 days worsening dyspnea that prompted him to come to the hospital. On his initial physical examination his oxygen saturation was 88% on room air, temperature 99.3, blood pressure 130/95, heart rate 70, respiratory rate 27.His lungs had decreased breath sounds bilaterally, no wheezing but bilateral rhonchi. Increased work of breathing. Heart S1-S2, present rhythmic, soft abdomen, no lower extremity edema. Sodium 133, potassium 4.3, chloride 98, bicarb 22, glucose 147, BUN 21, creatinine 0.73, white count 11.0, hemoglobin 17.5, hematocrit 50.6, platelets 213. SARS COVID-19 positive. Chest radiograph with bilateral lower lobe faint interstitial infiltrates. EKG 69 bpm, left axis deviation, normal intervals, sinus rhythm, J-point elevation V1 through V3, no ST segment or T wave changes.  Patient placed on medical therapy with systemic steroids, remdesivir and barcitinib.   Continue to have low oxygen requirements and improving inflammatory markers  Plan for possible dc home in am if continue to improve.    Assessment & Plan:   Principal Problem:   Pneumonia due to COVID-19 virus Active Problems:   Hyponatremia   Dehydration   Acute respiratory failure with hypoxia (HCC)   1.Acute hypoxic respiratory failure due to SARS COVID-19 viral pneumonia.  RR: 20  Pulse oxymetry: 98%  Fi02: Room air 21%  COVID-19 Labs  Recent Labs      07/25/20 1705 07/25/20 1707 07/26/20 0558 07/27/20 0340 07/28/20 0344  DDIMER 0.73*   < > 0.83* 0.61* 0.73*  FERRITIN  --    < > 953* 831* 799*  LDH 222*  --   --   --   --   CRP  --    < > 4.3* 1.4* 0.6   < > = values in this interval not displayed.    Lab Results  Component Value Date   SARSCOV2NAA POSITIVE (A) 07/25/2020    Inflammatory markers continue trending down, no increase in oxygen requirements. No nausea or vomiting.   Decrease steroids to methylprednisolone 40 mg daily, continue with baricitinib and Remdesivir #4/5. On bronchodilator therapy, airway clearing techniques, antitussive agents and prone position as tolerated.   If continue to improve, plan for discharge home in am.   2. Hypovolemic hyponatremia. Continue to have mild hyponatremia, patient is tolerating po well, continue to hold on IV fluids, follow up renal function in am. Cr is 0,72 and bicarbonate is 22.  3. Reactive leukocytosis. Resolved.    Status is: Inpatient  Remains inpatient appropriate because:IV treatments appropriate due to intensity of illness or inability to take PO   Dispo: The patient is from: Home              Anticipated d/c is to: Home              Anticipated d/c date is: 1 day              Patient currently is not medically stable to d/c.    DVT prophylaxis: Enoxaparin   Code Status:   full  Family Communication:  Patient communicating with his family by phone.      Subjective: Patient continue to recover well, dyspnea and level of energy continue to improve, no nausea or vomiting.   Objective: Vitals:   07/27/20 0542 07/27/20 1405 07/27/20 2039 07/28/20 0501  BP: (!) 132/92 (!) 151/94 (!) 146/95 (!) 124/92  Pulse: (!) 59 76 66 (!) 55  Resp: 20 19 (!) 22 20  Temp: 98.2 F (36.8 C) 98.4 F (36.9 C) 98.9 F (37.2 C) 98 F (36.7 C)  TempSrc:   Oral Oral  SpO2: 95% 94% 97% 98%  Weight:      Height:        Intake/Output Summary (Last 24 hours) at  07/28/2020 0950 Last data filed at 07/27/2020 2200 Gross per 24 hour  Intake 480 ml  Output --  Net 480 ml   Filed Weights   07/25/20 1319  Weight: 72.6 kg    Examination:   General: Not in pain or dyspnea.  Neurology: Awake and alert, non focal  E ENT: no pallor, no icterus, oral mucosa moist Cardiovascular: No JVD. S1-S2 present, rhythmic, no gallops, rubs, or murmurs. No lower extremity edema. Pulmonary: positive breath sounds bilaterally, no wheezing. Gastrointestinal. Abdomen soft and non tender Skin. No rashes Musculoskeletal: no joint deformities     Data Reviewed: I have personally reviewed following labs and imaging studies  CBC: Recent Labs  Lab 07/25/20 1705 07/26/20 0558  WBC 11.0* 8.2  NEUTROABS 9.6* 6.4  HGB 17.5* 16.0  HCT 50.6 46.5  MCV 89.4 90.1  PLT 213 231   Basic Metabolic Panel: Recent Labs  Lab 07/25/20 1705 07/26/20 0558 07/27/20 0340 07/28/20 0344  NA 133* 132* 134* 132*  K 4.3 4.2 4.5 4.5  CL 98 101 102 100  CO2 22 22 23 22   GLUCOSE 147* 151* 134* 156*  BUN 21* 18 22* 22*  CREATININE 0.73 0.71 0.69 0.72  CALCIUM 9.2 8.2* 8.6* 8.6*  MG  --  2.4  --   --   PHOS  --  3.2  --   --    GFR: Estimated Creatinine Clearance: 100.8 mL/min (by C-G formula based on SCr of 0.72 mg/dL). Liver Function Tests: Recent Labs  Lab 07/25/20 1705 07/26/20 0558 07/27/20 0340 07/28/20 0344  AST 34 41 32 23  ALT 67* 73* 79* 64*  ALKPHOS 81 64 64 61  BILITOT 0.9 0.8 0.7 0.7  PROT 7.7 6.7 6.6 6.3*  ALBUMIN 3.3* 2.8* 3.0* 2.8*   No results for input(s): LIPASE, AMYLASE in the last 168 hours. No results for input(s): AMMONIA in the last 168 hours. Coagulation Profile: No results for input(s): INR, PROTIME in the last 168 hours. Cardiac Enzymes: No results for input(s): CKTOTAL, CKMB, CKMBINDEX, TROPONINI in the last 168 hours. BNP (last 3 results) No results for input(s): PROBNP in the last 8760 hours. HbA1C: No results for input(s):  HGBA1C in the last 72 hours. CBG: No results for input(s): GLUCAP in the last 168 hours. Lipid Profile: Recent Labs    07/25/20 1700  TRIG 111   Thyroid Function Tests: No results for input(s): TSH, T4TOTAL, FREET4, T3FREE, THYROIDAB in the last 72 hours. Anemia Panel: Recent Labs    07/27/20 0340 07/28/20 0344  FERRITIN 831* 799*      Radiology Studies: I have reviewed all of the imaging during this hospital visit personally     Scheduled Meds: . vitamin C  500 mg Oral Daily  . baricitinib  4 mg Oral  Daily  . chlorpheniramine-HYDROcodone  5 mL Oral Q12H  . Ipratropium-Albuterol  1 puff Inhalation Q6H  . methylPREDNISolone (SOLU-MEDROL) injection  40 mg Intravenous Q12H  . zinc sulfate  220 mg Oral Daily   Continuous Infusions: . remdesivir 100 mg in NS 100 mL 100 mg (07/28/20 0942)     LOS: 3 days        Jacquis Paxton Annett Gula, MD

## 2020-07-28 NOTE — Plan of Care (Signed)
°  Problem: Safety: Goal: Ability to remain free from injury will improve Outcome: Progressing   Problem: Pain Managment: Goal: General experience of comfort will improve Outcome: Progressing   Problem: Elimination: Goal: Will not experience complications related to bowel motility Outcome: Progressing   Problem: Coping: Goal: Level of anxiety will decrease Outcome: Progressing

## 2020-07-29 DIAGNOSIS — E871 Hypo-osmolality and hyponatremia: Secondary | ICD-10-CM | POA: Diagnosis not present

## 2020-07-29 DIAGNOSIS — U071 COVID-19: Secondary | ICD-10-CM | POA: Diagnosis not present

## 2020-07-29 DIAGNOSIS — E860 Dehydration: Secondary | ICD-10-CM | POA: Diagnosis not present

## 2020-07-29 DIAGNOSIS — J9601 Acute respiratory failure with hypoxia: Secondary | ICD-10-CM | POA: Diagnosis not present

## 2020-07-29 LAB — FERRITIN: Ferritin: 743 ng/mL — ABNORMAL HIGH (ref 24–336)

## 2020-07-29 LAB — D-DIMER, QUANTITATIVE: D-Dimer, Quant: 0.41 ug/mL-FEU (ref 0.00–0.50)

## 2020-07-29 LAB — COMPREHENSIVE METABOLIC PANEL
ALT: 61 U/L — ABNORMAL HIGH (ref 0–44)
AST: 24 U/L (ref 15–41)
Albumin: 2.8 g/dL — ABNORMAL LOW (ref 3.5–5.0)
Alkaline Phosphatase: 61 U/L (ref 38–126)
Anion gap: 8 (ref 5–15)
BUN: 22 mg/dL — ABNORMAL HIGH (ref 6–20)
CO2: 24 mmol/L (ref 22–32)
Calcium: 8.2 mg/dL — ABNORMAL LOW (ref 8.9–10.3)
Chloride: 101 mmol/L (ref 98–111)
Creatinine, Ser: 0.69 mg/dL (ref 0.61–1.24)
GFR, Estimated: >60 mL/min (ref >60)
Glucose, Bld: 120 mg/dL — ABNORMAL HIGH (ref 70–99)
Potassium: 3.8 mmol/L (ref 3.5–5.1)
Sodium: 133 mmol/L — ABNORMAL LOW (ref 135–145)
Total Bilirubin: 0.9 mg/dL (ref 0.3–1.2)
Total Protein: 6 g/dL — ABNORMAL LOW (ref 6.5–8.1)

## 2020-07-29 LAB — C-REACTIVE PROTEIN: CRP: 0.6 mg/dL (ref <1.0)

## 2020-07-29 MED ORDER — ALBUTEROL SULFATE HFA 108 (90 BASE) MCG/ACT IN AERS: 1.0000 | INHALATION_SPRAY | Freq: Four times a day (QID) | RESPIRATORY_TRACT | 0 refills | Status: AC | PRN

## 2020-07-29 MED ORDER — ACETAMINOPHEN 325 MG PO TABS: 650.0000 mg | ORAL_TABLET | Freq: Four times a day (QID) | ORAL | Status: AC | PRN

## 2020-07-29 MED ORDER — GUAIFENESIN-DM 100-10 MG/5ML PO SYRP: 5.0000 mL | ORAL_SOLUTION | Freq: Four times a day (QID) | ORAL | 0 refills | Status: AC | PRN

## 2020-07-29 NOTE — TOC Progression Note (Signed)
Transition of Care [facility]) - Progression Note    Patient Details  Name: Anthony Hodges MRN: [id number] Date of Birth: 20-Apr-1971  [facility] CM/SW Contact  Armanda Heritage, RN Phone Number: 07/29/2020, 11:08 AM  Clinical Narrative:    CM spoke with patient who reports difficulty affording medications and lack of pcp.  CM provided information about the [facility] and instructed patient to call Monday morning for appointment and to apply for financial assistance, clinic is closed on weekends.  CM provided goodrx coupons for albuterol inhaler prescribed to patient which will help make the medication affordable for patient.    Expected Discharge Plan: Home/Self Care Barriers to Discharge: No Barriers Identified  Expected Discharge Plan and Services Expected Discharge Plan: Home/Self Care   Discharge Planning Services: CM Consult   Living arrangements for the past 2 months: Single Family Home Expected Discharge Date: 07/29/20               DME Arranged: N/A         HH Arranged: NA           Social Determinants of Health (SDOH) Interventions    Readmission Risk Interventions No flowsheet data found.

## 2020-07-29 NOTE — Plan of Care (Signed)

## 2020-07-29 NOTE — Progress Notes (Addendum)
Patient discharged via wheelchair accompanied by RN and NT. Pt. Is alert and oriented, in no acute distress. Discharge instruction given to patient. AVS paper reviewed pt. Verbalized understanding. All personal belongings are with the pt.

## 2020-07-29 NOTE — Discharge Summary (Signed)
Physician Discharge Summary  Anthony Hodges ZOX:[id number] DOB: [birth year] DOA: 07/25/2020  PCP: Patient, No Pcp Per  Admit date: 07/25/2020 Discharge date: 07/29/2020  Admitted From: Home  Disposition:  Home   Recommendations for Outpatient Follow-up and new medication changes:  1. Follow up with Primary care in 2 weeks.  2. Continue self quarantine for 2 weeks, use a mask in public and maintain physical distancing. It is recommended COVID vaccine after recovery per primary care.   Home Health: no   Equipment/Devices: na    Discharge Condition: stable  CODE STATUS: full  Diet recommendation: regular.   Brief/Interim Summary: Mr.Munozwas admitted to the hospital with the working diagnosis of acute hypoxic respiratory failure due to SARS COVID-19 viral pneumonia.  49 year old male with no significant past medical history who presents with dyspnea and cough. He was diagnosed with SARS COVID-19 on 07/15/2020. He was treated as an outpatient with 4 mg of dexamethasone, despite systemic steroids he continued to have intermittent fevers and for the last 2 days worsening dyspnea that prompted him to come to the hospital. On his initial physical examination his oxygen saturation was 88% on room air, temperature 99.3, blood pressure 130/95, heart rate 70, respiratory rate 27.His lungs had decreased breath sounds bilaterally, no wheezing but bilateral rhonchi. Increased work of breathing. Heart S1-S2, present rhythmic, soft abdomen, no lower extremity edema. Sodium 133, potassium 4.3, chloride 98, bicarb 22, glucose 147, BUN 21, creatinine 0.73, white count 11.0, hemoglobin 17.5, hematocrit 50.6, platelets 213. SARS COVID-19 positive. Chest radiograph with bilateral lower lobe faint interstitial infiltrates. EKG 69 bpm, left axis deviation, normal intervals, sinus rhythm, J-point elevation V1 through V3, no ST segment or T wave changes.  Patient placed on medical therapy with systemic  steroids, remdesivir and barcitinib.   Continue to have low oxygen requirements and improving inflammatory markers   1.  Acute hypoxic respiratory failure due to SARS COVID-19 viral pneumonia. Patient was admitted to the medical ward, he was placed on supplemental oxygen per nasal cannula, received medical therapy with systemic corticosteroids, remdesivir and baricitinib. He was treated with antitussive agents, bronchodilators and neuro 3 techniques with symptom spirometer flutter valve.  Patient tolerated well prone positioning.  His oxygenation, inflammatory markers, and symptoms improved.  COVID-19 Labs  Recent Labs    07/27/20 0340 07/28/20 0344 07/29/20 0347  DDIMER 0.61* 0.73* 0.41  FERRITIN 831* 799* 743*  CRP 1.4* 0.6 0.6    Lab Results  Component Value Date   SARSCOV2NAA POSITIVE (A) 07/25/2020   Patient will follow up within 2 weeks with primary care.  His oxygenation on room air is 99% at the time of discharge.  2.  Hypovolemic hyponatremia.  Patient received short course of intravenous isotonic saline, his oral intake improved, his discharge sodium is 133, potassium 3.8, chloride 101, bicarb 24, glucose 120, BUN 22, creatinine 0.69.  3.  Reactive leukocytosis.  No signs of bacterial coinfection, clinically resolved at discharge.   Discharge Diagnoses:  Principal Problem:   Pneumonia due to COVID-19 virus Active Problems:   Hyponatremia   Dehydration   Acute respiratory failure with hypoxia [facility])    Discharge Instructions   Allergies as of 07/29/2020   No Known Allergies     Medication List    STOP taking these medications   amoxicillin-clavulanate 875-125 MG tablet Commonly known as: AUGMENTIN   dexamethasone 4 MG tablet Commonly known as: DECADRON     TAKE these medications   acetaminophen 325 MG tablet Commonly known as:  TYLENOL Take 2 tablets (650 mg total) by mouth every 6 (six) hours as needed for mild pain or headache (fever >/=  101).   albuterol 108 (90 Base) MCG/ACT inhaler Commonly known as: VENTOLIN HFA Inhale 1 puff into the lungs every 6 (six) hours as needed for wheezing or shortness of breath.   guaiFENesin-dextromethorphan 100-10 MG/5ML syrup Commonly known as: ROBITUSSIN DM Take 5 mLs by mouth every 6 (six) hours as needed for cough.       No Known Allergies      Procedures/Studies: DG Chest Port 1 View  Result Date: 07/25/2020 CLINICAL DATA:  Shortness of breath, family member COVID positive EXAM: PORTABLE CHEST 1 VIEW COMPARISON:  None. FINDINGS: Patchy interstitial opacities at the lung bases. No pleural effusion. No pneumothorax. Normal heart size. IMPRESSION: Patchy interstitial opacities at the lung bases, which could reflect COVID-19. Electronically Signed   By: Guadlupe Spanish M.D.   On: 07/25/2020 14:21        Subjective: Patient is feeling better, dyspnea continue to improve, no nausea or vomiting, has been out of bed.   Discharge Exam: Vitals:   07/28/20 2101 07/29/20 0502  BP: 135/86 (!) 128/91  Pulse: 65 (!) 52  Resp: (!) 22 18  Temp: 97.7 F (36.5 C) 98.1 F (36.7 C)  SpO2: 95% 99%   Vitals:   07/28/20 0501 07/28/20 1320 07/28/20 2101 07/29/20 0502  BP: (!) 124/92 (!) 145/91 135/86 (!) 128/91  Pulse: (!) 55 77 65 (!) 52  Resp: 20 16 (!) 22 18  Temp: 98 F (36.7 C) 98.5 F (36.9 C) 97.7 F (36.5 C) 98.1 F (36.7 C)  TempSrc: Oral Oral Oral Oral  SpO2: 98% 95% 95% 99%  Weight:      Height:        General: Not in pain or dyspnea Neurology: Awake and alert, non focal  E ENT: no pallor, no icterus, oral mucosa moist Cardiovascular: No JVD. S1-S2 present, rhythmic, no gallops, rubs, or murmurs. No lower extremity edema. Pulmonary: positive breath sounds bilaterally,  Gastrointestinal. Abdomen soft and non tender Skin. No rashes Musculoskeletal: no joint deformities   The results of significant diagnostics from this hospitalization (including imaging,  microbiology, ancillary and laboratory) are listed below for reference.     Microbiology: Recent Results (from the past 240 hour(s))  Blood Culture (routine x 2)     Status: None (Preliminary result)   Collection Time: 07/25/20  5:00 PM   Specimen: BLOOD  Result Value Ref Range Status   Specimen Description   Final    BLOOD RIGHT WRIST Performed at [facility], [address] [place], [place] [address]    Special Requests   Final    BOTTLES DRAWN AEROBIC AND ANAEROBIC Blood Culture adequate volume Performed at [facility], [address], [place], [place] [address]    Culture   Final    NO GROWTH 3 DAYS Performed at [facility], [address], [place], [place] [address]    Report Status PENDING  Incomplete  Respiratory Panel by RT PCR (Flu A&B, Covid) - Nasopharyngeal Swab     Status: Abnormal   Collection Time: 07/25/20  5:05 PM   Specimen: Nasopharyngeal Swab  Result Value Ref Range Status   SARS Coronavirus 2 by RT PCR POSITIVE (A) NEGATIVE Final    Comment: RESULT CALLED TO, READ BACK BY AND VERIFIED WITH: LOWDERMILK,J. RN @1922  07/25/20 BILLINGSLEY,L (NOTE) SARS-CoV-2 target nucleic acids are DETECTED.  SARS-CoV-2 RNA is generally detectable in upper respiratory specimens  during the acute phase of infection. Positive results are indicative of the presence of the identified virus, but do not rule out bacterial infection or co-infection with other pathogens not detected by the test. Clinical correlation with patient history and other diagnostic information is necessary to determine patient infection status. The expected result is Negative.  Fact Sheet for Patients:  https://www.moore.com/https://www.fda.gov/media/142436/download  Fact Sheet for Healthcare Providers: https://www.young.biz/https://www.fda.gov/media/142435/download  This test is not yet approved or cleared by the [place] FDA and  has been authorized for detection and/or diagnosis of  SARS-CoV-2 by FDA under an Emergency Use Authorization (EUA).  This EUA will remain in effect (meaning this t est can be used) for the duration of  the COVID-19 declaration under Section 564(b)(1) of the Act, 21 U.S.C. section 360bbb-3(b)(1), unless the authorization is terminated or revoked sooner.      Influenza A by PCR NEGATIVE NEGATIVE Final   Influenza B by PCR NEGATIVE NEGATIVE Final    Comment: (NOTE) The Xpert Xpress SARS-CoV-2/FLU/RSV assay is intended as an aid in  the diagnosis of influenza from Nasopharyngeal swab specimens and  should not be used as a sole basis for treatment. Nasal washings and  aspirates are unacceptable for Xpert Xpress SARS-CoV-2/FLU/RSV  testing.  Fact Sheet for Patients: https://www.moore.com/https://www.fda.gov/media/142436/download  Fact Sheet for Healthcare Providers: https://www.young.biz/https://www.fda.gov/media/142435/download  This test is not yet approved or cleared by the [place] FDA and  has been authorized for detection and/or diagnosis of SARS-CoV-2 by  FDA under an Emergency Use Authorization (EUA). This EUA will remain  in effect (meaning this test can be used) for the duration of the  Covid-19 declaration under Section 564(b)(1) of the Act, 21  U.S.C. section 360bbb-3(b)(1), unless the authorization is  terminated or revoked. Performed at [facility], [address] [place], [place] [address]   Blood Culture (routine x 2)     Status: None (Preliminary result)   Collection Time: 07/25/20  5:05 PM   Specimen: BLOOD  Result Value Ref Range Status   Specimen Description   Final    BLOOD LEFT ANTECUBITAL Performed at [facility], [address] [place], [place] [address]    Special Requests   Final    BOTTLES DRAWN AEROBIC AND ANAEROBIC Blood Culture results may not be optimal due to an inadequate volume of blood received in culture bottles Performed at [facility], [address] [place], [place]  [id number]    Culture   Final    NO GROWTH 3 DAYS Performed at [facility], [address] [place], [place] [address]    Report Status PENDING  Incomplete     Labs: BNP (last 3 results) No results for input(s): BNP in the last 8760 hours. Basic Metabolic Panel: Recent Labs  Lab 07/25/20 1705 07/26/20 0558 07/27/20 0340 07/28/20 0344 07/29/20 0347  NA 133* 132* 134* 132* 133*  K 4.3 4.2 4.5 4.5 3.8  CL 98 101 102 100 101  CO2 22 22 23 22 24   GLUCOSE 147* 151* 134* 156* 120*  BUN 21* 18 22* 22* 22*  CREATININE 0.73 0.71 0.69 0.72 0.69  CALCIUM 9.2 8.2* 8.6* 8.6* 8.2*  MG  --  2.4  --   --   --   PHOS  --  3.2  --   --   --    Liver Function Tests: Recent Labs  Lab 07/25/20 1705 07/26/20  6606 07/27/20 0340 07/28/20 0344 07/29/20 0347  AST 34 41 32 23 24  ALT 67* 73* 79* 64* 61*  ALKPHOS 81 64 64 61 61  BILITOT 0.9 0.8 0.7 0.7 0.9  PROT 7.7 6.7 6.6 6.3* 6.0*  ALBUMIN 3.3* 2.8* 3.0* 2.8* 2.8*   No results for input(s): LIPASE, AMYLASE in the last 168 hours. No results for input(s): AMMONIA in the last 168 hours. CBC: Recent Labs  Lab 07/25/20 1705 07/26/20 0558  WBC 11.0* 8.2  NEUTROABS 9.6* 6.4  HGB 17.5* 16.0  HCT 50.6 46.5  MCV 89.4 90.1  PLT 213 231   Cardiac Enzymes: No results for input(s): CKTOTAL, CKMB, CKMBINDEX, TROPONINI in the last 168 hours. BNP: Invalid input(s): POCBNP CBG: No results for input(s): GLUCAP in the last 168 hours. D-Dimer Recent Labs    07/28/20 0344 07/29/20 0347  DDIMER 0.73* 0.41   Hgb A1c No results for input(s): HGBA1C in the last 72 hours. Lipid Profile No results for input(s): CHOL, HDL, LDLCALC, TRIG, CHOLHDL, LDLDIRECT in the last 72 hours. Thyroid function studies No results for input(s): TSH, T4TOTAL, T3FREE, THYROIDAB in the last 72 hours.  Invalid input(s): FREET3 Anemia work up Recent Labs    07/28/20 0344 07/29/20 0347  FERRITIN 799* 743*   Urinalysis No results found for: COLORURINE,  APPEARANCEUR, LABSPEC, PHURINE, GLUCOSEU, HGBUR, BILIRUBINUR, KETONESUR, PROTEINUR, UROBILINOGEN, NITRITE, LEUKOCYTESUR Sepsis Labs Invalid input(s): PROCALCITONIN,  WBC,  LACTICIDVEN Microbiology Recent Results (from the past 240 hour(s))  Blood Culture (routine x 2)     Status: None (Preliminary result)   Collection Time: 07/25/20  5:00 PM   Specimen: BLOOD  Result Value Ref Range Status   Specimen Description   Final    BLOOD RIGHT WRIST Performed at [facility], [address] [place], [place] [address]    Special Requests   Final    BOTTLES DRAWN AEROBIC AND ANAEROBIC Blood Culture adequate volume Performed at [facility], [address], [place], [place] [address]    Culture   Final    NO GROWTH 3 DAYS Performed at [facility], [address], [place], [place] [address]    Report Status PENDING  Incomplete  Respiratory Panel by RT PCR (Flu A&B, Covid) - Nasopharyngeal Swab     Status: Abnormal   Collection Time: 07/25/20  5:05 PM   Specimen: Nasopharyngeal Swab  Result Value Ref Range Status   SARS Coronavirus 2 by RT PCR POSITIVE (A) NEGATIVE Final    Comment: RESULT CALLED TO, READ BACK BY AND VERIFIED WITH: LOWDERMILK,J. RN @1922  07/25/20 BILLINGSLEY,L (NOTE) SARS-CoV-2 target nucleic acids are DETECTED.  SARS-CoV-2 RNA is generally detectable in upper respiratory specimens  during the acute phase of infection. Positive results are indicative of the presence of the identified virus, but do not rule out bacterial infection or co-infection with other pathogens not detected by the test. Clinical correlation with patient history and other diagnostic information is necessary to determine patient infection status. The expected result is Negative.  Fact Sheet for Patients:  07/27/20  Fact Sheet for Healthcare Providers: https://www.moore.com/  This test is not yet  approved or cleared by the https://www.young.biz/ FDA and  has been authorized for detection and/or diagnosis of SARS-CoV-2 by FDA under an Emergency Use Authorization (EUA).  This EUA will remain in effect (meaning this t est can be used) for the duration of  the COVID-19 declaration under Section 564(b)(1) of the Act, 21 U.S.C. section  360bbb-3(b)(1), unless the authorization is terminated or revoked sooner.      Influenza A by PCR NEGATIVE NEGATIVE Final   Influenza B by PCR NEGATIVE NEGATIVE Final    Comment: (NOTE) The Xpert Xpress SARS-CoV-2/FLU/RSV assay is intended as an aid in  the diagnosis of influenza from Nasopharyngeal swab specimens and  should not be used as a sole basis for treatment. Nasal washings and  aspirates are unacceptable for Xpert Xpress SARS-CoV-2/FLU/RSV  testing.  Fact Sheet for Patients: https://www.moore.com/  Fact Sheet for Healthcare Providers: https://www.young.biz/  This test is not yet approved or cleared by the [place] FDA and  has been authorized for detection and/or diagnosis of SARS-CoV-2 by  FDA under an Emergency Use Authorization (EUA). This EUA will remain  in effect (meaning this test can be used) for the duration of the  Covid-19 declaration under Section 564(b)(1) of the Act, 21  U.S.C. section 360bbb-3(b)(1), unless the authorization is  terminated or revoked. Performed at [facility], [address], [place], [place] [address]   Blood Culture (routine x 2)     Status: None (Preliminary result)   Collection Time: 07/25/20  5:05 PM   Specimen: BLOOD  Result Value Ref Range Status   Specimen Description   Final    BLOOD LEFT ANTECUBITAL Performed at [facility], [address], [place], [place] [address]    Special Requests   Final    BOTTLES DRAWN AEROBIC AND ANAEROBIC Blood Culture results may not be optimal due to an inadequate volume of blood  received in culture bottles Performed at [facility], [address], [place], [place] [address]    Culture   Final    NO GROWTH 3 DAYS Performed at [facility], [address] [place], [place] [address]    Report Status PENDING  Incomplete     Time coordinating discharge: 45 minutes  SIGNED:   Coralie Keens, MD  [facility] 07/29/2020, 8:55 AM

## 2020-07-30 LAB — CULTURE, BLOOD (ROUTINE X 2)
Culture: NO GROWTH
Culture: NO GROWTH
Special Requests: ADEQUATE

## 2020-08-01 ENCOUNTER — Telehealth: Payer: Self-pay

## 2020-08-01 NOTE — Telephone Encounter (Signed)
Message received from Wyline Copas CM requesting a follow up appointment for patient at [facility].  Call placed to patient with assistance of Spanish interpreter # [id number]/[employer].  The patient said that he preferred to call the clinic to schedule an appointment.  He did not want to schedule one at this time.  Provided him with the phone number for [facility]  Update provided to D. Allene Dillon, RN CM

## 2021-10-25 IMAGING — DX DG CHEST 1V PORT
1 series · 1 of 1 positions shown · non-contrast
Comparison: None.

CLINICAL DATA: Shortness of breath, family member COVID positive

EXAM:
PORTABLE CHEST 1 VIEW

[chest ap]
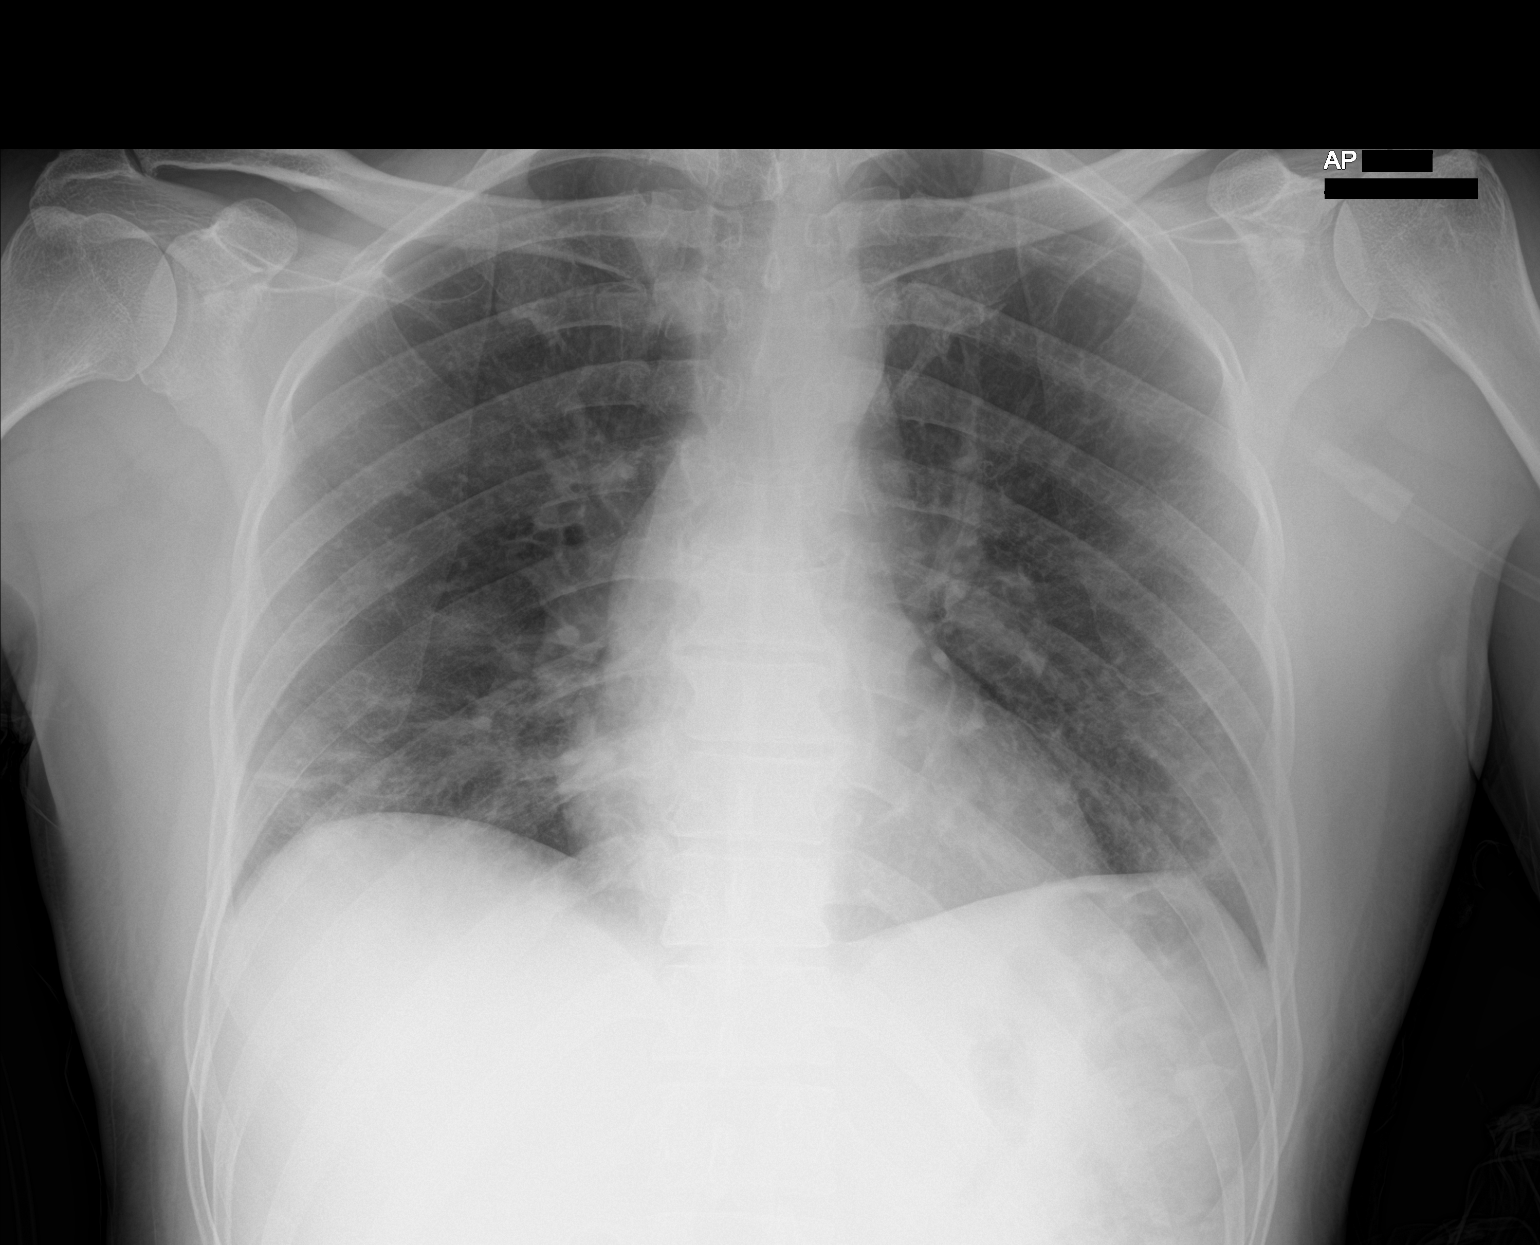

[1 of 1 positions shown; findings below may reference images not displayed]

FINDINGS: Patchy interstitial opacities at the lung bases. No pleural
effusion. No pneumothorax. Normal heart size.
IMPRESSION: Patchy interstitial opacities at the lung bases, which could reflect
[id number].
